# Patient Record
Sex: Female | Born: 1935 | Race: White | Hispanic: No | Smoking: Never smoker
Health system: Southern US, Community
[De-identification: ages and names within clinical notes are randomized; demographics above are authoritative.]

## PROBLEM LIST (undated history)

## (undated) DIAGNOSIS — I499 Cardiac arrhythmia, unspecified: Secondary | ICD-10-CM

## (undated) DIAGNOSIS — M810 Age-related osteoporosis without current pathological fracture: Secondary | ICD-10-CM

## (undated) DIAGNOSIS — N189 Chronic kidney disease, unspecified: Secondary | ICD-10-CM

## (undated) DIAGNOSIS — K219 Gastro-esophageal reflux disease without esophagitis: Secondary | ICD-10-CM

## (undated) DIAGNOSIS — I10 Essential (primary) hypertension: Secondary | ICD-10-CM

## (undated) DIAGNOSIS — E7800 Pure hypercholesterolemia, unspecified: Secondary | ICD-10-CM

## (undated) DIAGNOSIS — E039 Hypothyroidism, unspecified: Secondary | ICD-10-CM

## (undated) HISTORY — PX: COLONOSCOPY: SHX174

---

## 2010-11-30 ENCOUNTER — Ambulatory Visit: Payer: Self-pay | Admitting: Endocrinology

## 2015-10-14 ENCOUNTER — Ambulatory Visit
Admission: RE | Admit: 2015-10-14 | Discharge: 2015-10-14 | Disposition: A | Discharge status: Home / Self Care | Payer: Medicare HMO | Source: Ambulatory Visit | Attending: Ophthalmology | Admitting: Ophthalmology

## 2015-10-14 ENCOUNTER — Encounter
Admission: RE | Disposition: A | Discharge status: Home / Self Care | Payer: Self-pay | Source: Ambulatory Visit | Attending: Ophthalmology

## 2015-10-14 ENCOUNTER — Ambulatory Visit: Payer: Medicare HMO | Admitting: Certified Registered"

## 2015-10-14 ENCOUNTER — Encounter: Payer: Self-pay | Admitting: *Deleted

## 2015-10-14 DIAGNOSIS — I129 Hypertensive chronic kidney disease with stage 1 through stage 4 chronic kidney disease, or unspecified chronic kidney disease: Secondary | ICD-10-CM | POA: Diagnosis not present

## 2015-10-14 DIAGNOSIS — Z79899 Other long term (current) drug therapy: Secondary | ICD-10-CM | POA: Insufficient documentation

## 2015-10-14 DIAGNOSIS — N189 Chronic kidney disease, unspecified: Secondary | ICD-10-CM | POA: Diagnosis not present

## 2015-10-14 DIAGNOSIS — E039 Hypothyroidism, unspecified: Secondary | ICD-10-CM | POA: Diagnosis not present

## 2015-10-14 DIAGNOSIS — E7800 Pure hypercholesterolemia, unspecified: Secondary | ICD-10-CM | POA: Diagnosis not present

## 2015-10-14 DIAGNOSIS — H2511 Age-related nuclear cataract, right eye: Secondary | ICD-10-CM | POA: Insufficient documentation

## 2015-10-14 DIAGNOSIS — Z883 Allergy status to other anti-infective agents status: Secondary | ICD-10-CM | POA: Diagnosis not present

## 2015-10-14 DIAGNOSIS — M810 Age-related osteoporosis without current pathological fracture: Secondary | ICD-10-CM | POA: Diagnosis not present

## 2015-10-14 DIAGNOSIS — Z9889 Other specified postprocedural states: Secondary | ICD-10-CM | POA: Insufficient documentation

## 2015-10-14 DIAGNOSIS — H269 Unspecified cataract: Secondary | ICD-10-CM | POA: Diagnosis present

## 2015-10-14 HISTORY — DX: Age-related osteoporosis without current pathological fracture: M81.0

## 2015-10-14 HISTORY — DX: Essential (primary) hypertension: I10

## 2015-10-14 HISTORY — DX: Hypothyroidism, unspecified: E03.9

## 2015-10-14 HISTORY — PX: CATARACT EXTRACTION W/PHACO: SHX586

## 2015-10-14 HISTORY — DX: Pure hypercholesterolemia, unspecified: E78.00

## 2015-10-14 HISTORY — DX: Cardiac arrhythmia, unspecified: I49.9

## 2015-10-14 HISTORY — DX: Chronic kidney disease, unspecified: N18.9

## 2015-10-14 SURGERY — PHACOEMULSIFICATION, CATARACT, WITH IOL INSERTION
Anesthesia: Monitor Anesthesia Care | Site: Eye | Laterality: Right | Wound class: Clean

## 2015-10-14 MED ORDER — TETRACAINE HCL 0.5 % OP SOLN
OPHTHALMIC | Status: AC
Start: 2015-10-14 — End: 2015-10-14
  Administered 2015-10-14: 1 [drp] via OPHTHALMIC
  Filled 2015-10-14: qty 2

## 2015-10-14 MED ORDER — MOXIFLOXACIN HCL 0.5 % OP SOLN
OPHTHALMIC | Status: AC
  Filled 2015-10-14: qty 3

## 2015-10-14 MED ORDER — SODIUM CHLORIDE 0.9 % IV SOLN
INTRAVENOUS | Status: DC
  Administered 2015-10-14: 08:00:00 via INTRAVENOUS

## 2015-10-14 MED ORDER — EPINEPHRINE HCL 1 MG/ML IJ SOLN
INTRAOCULAR | Status: DC | PRN
  Administered 2015-10-14: 1 mL via OPHTHALMIC

## 2015-10-14 MED ORDER — POVIDONE-IODINE 5 % OP SOLN
OPHTHALMIC | Status: AC
  Administered 2015-10-14: 1 via OPHTHALMIC
  Filled 2015-10-14: qty 30

## 2015-10-14 MED ORDER — NA CHONDROIT SULF-NA HYALURON 40-17 MG/ML IO SOLN
INTRAOCULAR | Status: AC
  Filled 2015-10-14: qty 1

## 2015-10-14 MED ORDER — CEFUROXIME OPHTHALMIC INJECTION 1 MG/0.1 ML
INJECTION | OPHTHALMIC | Status: DC | PRN
  Administered 2015-10-14: .1 mL via INTRACAMERAL

## 2015-10-14 MED ORDER — MOXIFLOXACIN HCL 0.5 % OP SOLN
OPHTHALMIC | Status: DC | PRN
  Administered 2015-10-14: 1 [drp] via OPHTHALMIC

## 2015-10-14 MED ORDER — EPINEPHRINE HCL 1 MG/ML IJ SOLN
INTRAMUSCULAR | Status: AC
  Filled 2015-10-14: qty 1

## 2015-10-14 MED ORDER — CARBACHOL 0.01 % IO SOLN
INTRAOCULAR | Status: DC | PRN
  Administered 2015-10-14: .5 mL via INTRAOCULAR

## 2015-10-14 MED ORDER — ARMC OPHTHALMIC DILATING GEL
1.0000 "application " | OPHTHALMIC | Status: DC | PRN
  Administered 2015-10-14: 1 via OPHTHALMIC

## 2015-10-14 MED ORDER — CEFUROXIME OPHTHALMIC INJECTION 1 MG/0.1 ML
INJECTION | OPHTHALMIC | Status: AC
  Filled 2015-10-14: qty 0.1

## 2015-10-14 MED ORDER — MIDAZOLAM HCL 2 MG/2ML IJ SOLN
INTRAMUSCULAR | Status: DC | PRN
  Administered 2015-10-14: 1 mg via INTRAVENOUS

## 2015-10-14 MED ORDER — MOXIFLOXACIN HCL 0.5 % OP SOLN: 1.0000 [drp] | OPHTHALMIC | Status: DC | PRN

## 2015-10-14 MED ORDER — POVIDONE-IODINE 5 % OP SOLN
1.0000 "application " | OPHTHALMIC | Status: AC | PRN
  Administered 2015-10-14: 1 via OPHTHALMIC

## 2015-10-14 MED ORDER — ARMC OPHTHALMIC DILATING GEL
OPHTHALMIC | Status: AC
  Administered 2015-10-14: 1 via OPHTHALMIC
  Filled 2015-10-14: qty 0.25

## 2015-10-14 MED ORDER — TETRACAINE HCL 0.5 % OP SOLN
1.0000 [drp] | Freq: Once | OPHTHALMIC | Status: AC
  Administered 2015-10-14: 1 [drp] via OPHTHALMIC

## 2015-10-14 MED ORDER — NA CHONDROIT SULF-NA HYALURON 40-17 MG/ML IO SOLN
INTRAOCULAR | Status: DC | PRN
  Administered 2015-10-14: 1 mL via INTRAOCULAR

## 2015-10-14 SURGICAL SUPPLY — 22 items
CANNULA ANT/CHMB 27GA (MISCELLANEOUS) ×3 IMPLANT
CUP MEDICINE 2OZ PLAST GRAD ST (MISCELLANEOUS) ×3 IMPLANT
GLOVE BIO SURGEON STRL SZ8 (GLOVE) ×3 IMPLANT
GLOVE BIOGEL M 6.5 STRL (GLOVE) ×3 IMPLANT
GLOVE SURG LX 8.0 MICRO (GLOVE) ×2
GLOVE SURG LX STRL 8.0 MICRO (GLOVE) ×1 IMPLANT
GOWN STRL REUS W/ TWL LRG LVL3 (GOWN DISPOSABLE) ×2 IMPLANT
GOWN STRL REUS W/TWL LRG LVL3 (GOWN DISPOSABLE) ×4
LENS IOL TECNIS 19.0 (Intraocular Lens) ×3 IMPLANT
LENS IOL TECNIS MONO 1P 19.0 (Intraocular Lens) ×1 IMPLANT
PACK CATARACT (MISCELLANEOUS) ×3 IMPLANT
PACK CATARACT BRASINGTON LX (MISCELLANEOUS) ×3 IMPLANT
PACK EYE AFTER SURG (MISCELLANEOUS) ×3 IMPLANT
SOL BSS BAG (MISCELLANEOUS) ×3
SOL PREP PVP 2OZ (MISCELLANEOUS) ×3
SOLUTION BSS BAG (MISCELLANEOUS) ×1 IMPLANT
SOLUTION PREP PVP 2OZ (MISCELLANEOUS) ×1 IMPLANT
SYR 3ML LL SCALE MARK (SYRINGE) ×3 IMPLANT
SYR 5ML LL (SYRINGE) ×3 IMPLANT
SYR TB 1ML 27GX1/2 LL (SYRINGE) ×3 IMPLANT
WATER STERILE IRR 1000ML POUR (IV SOLUTION) ×3 IMPLANT
WIPE NON LINTING 3.25X3.25 (MISCELLANEOUS) ×3 IMPLANT

## 2015-10-14 NOTE — Transfer of Care (Signed)
Immediate Anesthesia Transfer of Care Note  Patient: Megan Graves  Procedure(s) Performed: Procedure(s) with comments: CATARACT EXTRACTION PHACO AND INTRAOCULAR LENS PLACEMENT (IOC) (Right) - [place] 00:54 AP% 18.6 CDE 10.08 fluid pack lot # [id number]  Patient Location: Short Stay  Anesthesia Type:MAC  Level of Consciousness: awake and alert   Airway & Oxygen Therapy: Patient Spontanous Breathing  Post-op Assessment: Report given to RN  Post vital signs: Reviewed  Last Vitals:  Filed Vitals:   10/14/15 0746 10/14/15 0910  BP: 185/87 148/73  Pulse: 61 54  Temp: 36.6 C 36.6 C  Resp: 16 14    Complications: No apparent anesthesia complications

## 2015-10-14 NOTE — Anesthesia Preprocedure Evaluation (Signed)
Anesthesia Evaluation  Patient identified by MRN, date of birth, ID band Patient awake    Reviewed: Allergy & Precautions, H&P , NPO status , Patient's Chart, lab work & pertinent test results, reviewed documented beta blocker date and time   History of Anesthesia Complications Negative for: history of anesthetic complications  Airway Mallampati: III  TM Distance: >3 FB Neck ROM: full    Dental no notable dental hx. (+) Caps, Teeth Intact   Pulmonary neg pulmonary ROS,    Pulmonary exam normal breath sounds clear to auscultation       Cardiovascular Exercise Tolerance: Good hypertension, (-) angina(-) CAD, (-) Past MI, (-) Cardiac Stents and (-) CABG Normal cardiovascular exam+ dysrhythmias (-) Valvular Problems/Murmurs Rhythm:regular Rate:Normal     Neuro/Psych negative neurological ROS  negative psych ROS   GI/Hepatic negative GI ROS, Neg liver ROS,   Endo/Other  neg diabetesHypothyroidism   Renal/GU CRFRenal disease  negative genitourinary   Musculoskeletal   Abdominal   Peds  Hematology negative hematology ROS (+)   Anesthesia Other Findings Past Medical History:   Hypertension                                                 Dysrhythmia                                                  Hypercholesteremia                                           Chronic kidney disease                                       Osteoporosis                                                 Hypothyroidism                                               Reproductive/Obstetrics negative OB ROS                             Anesthesia Physical Anesthesia Plan  ASA: II  Anesthesia Plan: MAC   Post-op Pain Management:    Induction:   Airway Management Planned:   Additional Equipment:   Intra-op Plan:   Post-operative Plan:   Informed Consent: I have reviewed the patients History and Physical, chart,  labs and discussed the procedure including the risks, benefits and alternatives for the proposed anesthesia with the patient or authorized representative who has indicated his/her understanding and acceptance.   Dental Advisory Given  Plan Discussed with: Anesthesiologist, CRNA and Surgeon  Anesthesia Plan Comments:  Anesthesia Quick Evaluation  

## 2015-10-14 NOTE — Anesthesia Postprocedure Evaluation (Signed)
Anesthesia Post Note  Patient: Megan Graves  Procedure(s) Performed: Procedure(s) (LRB): CATARACT EXTRACTION PHACO AND INTRAOCULAR LENS PLACEMENT (IOC) (Right)  Patient location during evaluation: Short Stay Anesthesia Type: MAC Level of consciousness: awake and alert Pain management: pain level controlled Vital Signs Assessment: post-procedure vital signs reviewed and stable Respiratory status: spontaneous breathing Cardiovascular status: blood pressure returned to baseline Postop Assessment: no headache Anesthetic complications: no    Last Vitals:  Filed Vitals:   10/14/15 0746 10/14/15 0910  BP: 185/87 148/73  Pulse: 61 54  Temp: 36.6 C 36.6 C  Resp: 16 14    Last Pain: There were no vitals filed for this visit.               Mathews Argyle P

## 2015-10-14 NOTE — Op Note (Signed)
PREOPERATIVE DIAGNOSIS:  Nuclear sclerotic cataract of the right eye.   POSTOPERATIVE DIAGNOSIS: nuclear sclerotic cataract right eye   OPERATIVE PROCEDURE:  Procedure(s): CATARACT EXTRACTION PHACO AND INTRAOCULAR LENS PLACEMENT (IOC)   SURGEON:  Galen Manila, MD.   ANESTHESIA:  Anesthesiologist: Lenard Simmer, MD CRNA: Mathews Argyle, CRNA; Ginger Carne, CRNA  1.      Managed anesthesia care. 2.      Topical tetracaine drops followed by 2% Xylocaine jelly applied in the preoperative holding area.   COMPLICATIONS:  None.   TECHNIQUE:   Stop and chop   DESCRIPTION OF PROCEDURE:  The patient was examined and consented in the preoperative holding area where the aforementioned topical anesthesia was applied to the right eye and then brought back to the Operating Room where the right eye was prepped and draped in the usual sterile ophthalmic fashion and a lid speculum was placed. A paracentesis was created with the side port blade and the anterior chamber was filled with viscoelastic. A near clear corneal incision was performed with the steel keratome. A continuous curvilinear capsulorrhexis was performed with a cystotome followed by the capsulorrhexis forceps. Hydrodissection and hydrodelineation were carried out with BSS on a blunt cannula. The lens was removed in a stop and chop  technique and the remaining cortical material was removed with the irrigation-aspiration handpiece. The capsular bag was inflated with viscoelastic and the Technis ZCB00  lens was placed in the capsular bag without complication. The remaining viscoelastic was removed from the eye with the irrigation-aspiration handpiece. The wounds were hydrated. The anterior chamber was flushed with Miostat and the eye was inflated to physiologic pressure. 0.1 mL of cefuroxime concentration 10 mg/mL was placed in the anterior chamber. The wounds were found to be water tight. The eye was dressed with Vigamox. The patient was  given protective glasses to wear throughout the day and a shield with which to sleep tonight. The patient was also given drops with which to begin a drop regimen today and will follow-up with me in one day.  Implant Name Type Inv. Item Serial No. Manufacturer Lot No. LRB No. Used  LENS IMPL INTRAOC ZCB00 19.0 - [id number] Intraocular Lens LENS IMPL INTRAOC ZCB00 19.0 [id number]   Right 1   Procedure(s) with comments: CATARACT EXTRACTION PHACO AND INTRAOCULAR LENS PLACEMENT (IOC) (Right) - [place] 00:54 AP% 18.6 CDE 10.08 fluid pack lot # [id number]  Electronically signed: Meribeth Vitug LOUIS 10/14/2015 9:06 AM

## 2015-10-14 NOTE — H&P (Signed)
All labs reviewed. Abnormal studies sent to patients PCP when indicated.  Previous H&P reviewed, patient examined, there are NO CHANGES.  Megan Graves LOUIS2/14/20178:41 AM

## 2015-10-14 NOTE — Discharge Instructions (Signed)
AMBULATORY SURGERY  °DISCHARGE INSTRUCTIONS ° ° °1) The drugs that you were given will stay in your system until tomorrow so for the next 24 hours you should not: ° °A) Drive an automobile °B) Make any legal decisions °C) Drink any alcoholic beverage ° ° °2) You may resume regular meals tomorrow.  Today it is better to start with liquids and gradually work up to solid foods. ° °You may eat anything you prefer, but it is better to start with liquids, then soup and crackers, and gradually work up to solid foods. ° ° °3) Please notify your doctor immediately if you have any unusual bleeding, trouble breathing, redness and pain at the surgery site, drainage, fever, or pain not relieved by medication. ° ° ° °4) Additional Instructions: ° ° ° ° ° ° ° °Please contact your physician with any problems or Same Day Surgery at [phone], Monday through Friday 6 am to 4 pm, or [place] [facility] Main number at [phone].AMBULATORY SURGERY  °DISCHARGE INSTRUCTIONS ° ° °5) The drugs that you were given will stay in your system until tomorrow so for the next 24 hours you should not: ° °D) Drive an automobile °E) Make any legal decisions °F) Drink any alcoholic beverage ° ° °6) You may resume regular meals tomorrow.  Today it is better to start with liquids and gradually work up to solid foods. ° °You may eat anything you prefer, but it is better to start with liquids, then soup and crackers, and gradually work up to solid foods. ° ° °7) Please notify your doctor immediately if you have any unusual bleeding, trouble breathing, redness and pain at the surgery site, drainage, fever, or pain not relieved by medication. ° ° ° °8) Additional Instructions: ° ° ° ° ° ° ° °Please contact your physician with any problems or Same Day Surgery at [phone], Monday through Friday 6 am to 4 pm, or [place] [facility] Main number at [phone]. °

## 2016-06-29 ENCOUNTER — Ambulatory Visit
Admission: RE | Admit: 2016-06-29 | Discharge: 2016-06-29 | Disposition: A | Discharge status: Home / Self Care | Payer: Medicare HMO | Source: Ambulatory Visit | Attending: Ophthalmology | Admitting: Ophthalmology

## 2016-06-29 ENCOUNTER — Ambulatory Visit: Payer: Medicare HMO | Admitting: Certified Registered Nurse Anesthetist

## 2016-06-29 ENCOUNTER — Encounter: Payer: Self-pay | Admitting: *Deleted

## 2016-06-29 ENCOUNTER — Encounter
Admission: RE | Disposition: A | Discharge status: Home / Self Care | Payer: Self-pay | Source: Ambulatory Visit | Attending: Ophthalmology

## 2016-06-29 DIAGNOSIS — N189 Chronic kidney disease, unspecified: Secondary | ICD-10-CM | POA: Insufficient documentation

## 2016-06-29 DIAGNOSIS — M810 Age-related osteoporosis without current pathological fracture: Secondary | ICD-10-CM | POA: Diagnosis not present

## 2016-06-29 DIAGNOSIS — H2512 Age-related nuclear cataract, left eye: Secondary | ICD-10-CM | POA: Diagnosis present

## 2016-06-29 DIAGNOSIS — E669 Obesity, unspecified: Secondary | ICD-10-CM | POA: Diagnosis not present

## 2016-06-29 DIAGNOSIS — Z6827 Body mass index (BMI) 27.0-27.9, adult: Secondary | ICD-10-CM | POA: Diagnosis not present

## 2016-06-29 DIAGNOSIS — I10 Essential (primary) hypertension: Secondary | ICD-10-CM | POA: Insufficient documentation

## 2016-06-29 DIAGNOSIS — E7800 Pure hypercholesterolemia, unspecified: Secondary | ICD-10-CM | POA: Diagnosis not present

## 2016-06-29 DIAGNOSIS — K219 Gastro-esophageal reflux disease without esophagitis: Secondary | ICD-10-CM | POA: Diagnosis not present

## 2016-06-29 DIAGNOSIS — I129 Hypertensive chronic kidney disease with stage 1 through stage 4 chronic kidney disease, or unspecified chronic kidney disease: Secondary | ICD-10-CM | POA: Insufficient documentation

## 2016-06-29 DIAGNOSIS — E039 Hypothyroidism, unspecified: Secondary | ICD-10-CM | POA: Diagnosis not present

## 2016-06-29 HISTORY — DX: Gastro-esophageal reflux disease without esophagitis: K21.9

## 2016-06-29 HISTORY — PX: CATARACT EXTRACTION W/PHACO: SHX586

## 2016-06-29 SURGERY — PHACOEMULSIFICATION, CATARACT, WITH IOL INSERTION
Anesthesia: Monitor Anesthesia Care | Site: Eye | Laterality: Left | Wound class: Clean

## 2016-06-29 MED ORDER — ARMC OPHTHALMIC DILATING DROPS
1.0000 | OPHTHALMIC | Status: DC | PRN
Start: 2016-06-29 — End: 2016-06-29
  Administered 2016-06-29 (×3): 1 via OPHTHALMIC

## 2016-06-29 MED ORDER — MOXIFLOXACIN HCL 0.5 % OP SOLN
1.0000 [drp] | OPHTHALMIC | Status: DC | PRN
  Administered 2016-06-29 (×3): 1 [drp] via OPHTHALMIC

## 2016-06-29 MED ORDER — ARMC OPHTHALMIC DILATING DROPS
OPHTHALMIC | Status: AC
  Filled 2016-06-29: qty 0.4

## 2016-06-29 MED ORDER — MOXIFLOXACIN HCL 0.5 % OP SOLN
OPHTHALMIC | Status: DC | PRN
  Administered 2016-06-29: 1 [drp] via OPHTHALMIC

## 2016-06-29 MED ORDER — NA CHONDROIT SULF-NA HYALURON 40-17 MG/ML IO SOLN
INTRAOCULAR | Status: DC | PRN
  Administered 2016-06-29: 1 mL via INTRAOCULAR

## 2016-06-29 MED ORDER — POVIDONE-IODINE 5 % OP SOLN
OPHTHALMIC | Status: AC
  Filled 2016-06-29: qty 30

## 2016-06-29 MED ORDER — LIDOCAINE HCL (PF) 4 % IJ SOLN
INTRAMUSCULAR | Status: AC
  Filled 2016-06-29: qty 5

## 2016-06-29 MED ORDER — NA CHONDROIT SULF-NA HYALURON 40-17 MG/ML IO SOLN
INTRAOCULAR | Status: AC
  Filled 2016-06-29: qty 1

## 2016-06-29 MED ORDER — EPINEPHRINE PF 1 MG/ML IJ SOLN
INTRAOCULAR | Status: DC | PRN
  Administered 2016-06-29: 1 mL via OPHTHALMIC

## 2016-06-29 MED ORDER — LIDOCAINE HCL (PF) 4 % IJ SOLN
INTRAOCULAR | Status: DC | PRN
  Administered 2016-06-29: 4 mL via OPHTHALMIC

## 2016-06-29 MED ORDER — POVIDONE-IODINE 5 % OP SOLN
OPHTHALMIC | Status: DC | PRN
  Administered 2016-06-29: 1 via OPHTHALMIC

## 2016-06-29 MED ORDER — CARBACHOL 0.01 % IO SOLN
INTRAOCULAR | Status: DC | PRN
  Administered 2016-06-29: 0.5 mL via INTRAOCULAR

## 2016-06-29 MED ORDER — EPINEPHRINE PF 1 MG/ML IJ SOLN
INTRAMUSCULAR | Status: AC
Start: 2016-06-29 — End: 2016-06-29
  Filled 2016-06-29: qty 2

## 2016-06-29 MED ORDER — FENTANYL CITRATE (PF) 100 MCG/2ML IJ SOLN
INTRAMUSCULAR | Status: DC | PRN
  Administered 2016-06-29: 50 ug via INTRAVENOUS

## 2016-06-29 MED ORDER — MOXIFLOXACIN HCL 0.5 % OP SOLN
OPHTHALMIC | Status: AC
  Filled 2016-06-29: qty 3

## 2016-06-29 MED ORDER — SODIUM CHLORIDE 0.9 % IV SOLN
INTRAVENOUS | Status: DC
Start: 2016-06-29 — End: 2016-06-29
  Administered 2016-06-29: 10:00:00 via INTRAVENOUS

## 2016-06-29 SURGICAL SUPPLY — 21 items
CANNULA ANT/CHMB 27GA (MISCELLANEOUS) ×3 IMPLANT
CUP MEDICINE 2OZ PLAST GRAD ST (MISCELLANEOUS) ×3 IMPLANT
GLOVE BIO SURGEON STRL SZ8 (GLOVE) ×3 IMPLANT
GLOVE BIOGEL M 6.5 STRL (GLOVE) ×3 IMPLANT
GLOVE SURG LX 8.0 MICRO (GLOVE) ×2
GLOVE SURG LX STRL 8.0 MICRO (GLOVE) ×1 IMPLANT
GOWN STRL REUS W/ TWL LRG LVL3 (GOWN DISPOSABLE) ×2 IMPLANT
GOWN STRL REUS W/TWL LRG LVL3 (GOWN DISPOSABLE) ×4
LENS IOL TECNIS ITEC 19.5 (Intraocular Lens) ×3 IMPLANT
PACK CATARACT (MISCELLANEOUS) ×3 IMPLANT
PACK CATARACT BRASINGTON LX (MISCELLANEOUS) ×3 IMPLANT
PACK EYE AFTER SURG (MISCELLANEOUS) ×3 IMPLANT
SOL BSS BAG (MISCELLANEOUS) ×3
SOL PREP PVP 2OZ (MISCELLANEOUS) ×3
SOLUTION BSS BAG (MISCELLANEOUS) ×1 IMPLANT
SOLUTION PREP PVP 2OZ (MISCELLANEOUS) ×1 IMPLANT
SYR 3ML LL SCALE MARK (SYRINGE) ×3 IMPLANT
SYR 5ML LL (SYRINGE) ×3 IMPLANT
SYR TB 1ML 27GX1/2 LL (SYRINGE) ×3 IMPLANT
WATER STERILE IRR 250ML POUR (IV SOLUTION) ×3 IMPLANT
WIPE NON LINTING 3.25X3.25 (MISCELLANEOUS) ×3 IMPLANT

## 2016-06-29 NOTE — Anesthesia Postprocedure Evaluation (Signed)
Anesthesia Post Note  Patient: Megan Graves  Procedure(s) Performed: Procedure(s) (LRB): CATARACT EXTRACTION PHACO AND INTRAOCULAR LENS PLACEMENT (IOC) (Left)  Patient location during evaluation: PACU Anesthesia Type: MAC Level of consciousness: awake and alert and oriented Pain management: satisfactory to patient Vital Signs Assessment: post-procedure vital signs reviewed and stable Respiratory status: respiratory function stable Cardiovascular status: stable Anesthetic complications: no    Last Vitals:  Vitals:   06/29/16 0924  BP: (!) 150/88  Pulse: (!) 53  Resp: 16  Temp: 36.6 C    Last Pain:  Vitals:   06/29/16 0924  TempSrc: Oral                 Clydene PughBeane, Alize Borrayo D

## 2016-06-29 NOTE — Discharge Instructions (Signed)
Eye Surgery Discharge Instructions  Expect mild scratchy sensation or mild soreness. DO NOT RUB YOUR EYE!  The day of surgery:  Minimal physical activity, but bed rest is not required  No reading, computer work, or close hand work  No bending, lifting, or straining.  May watch TV  For 24 hours:  No driving, legal decisions, or alcoholic beverages  Safety precautions  Eat anything you prefer: It is better to start with liquids, then soup then solid foods.  _____ Eye patch should be worn until postoperative exam tomorrow.  ____ Solar shield eyeglasses should be worn for comfort in the sunlight/patch while sleeping  Resume all regular medications including aspirin or Coumadin if these were discontinued prior to surgery. You may shower, bathe, shave, or wash your hair. Tylenol may be taken for mild discomfort.  Call your doctor if you experience significant pain, nausea, or vomiting, fever > 101 or other signs of infection. [phone] or [phone] Specific instructions:  Follow-up Information    Carlena BjornstadPORFILIO,WILLIAM LOUIS, MD .   Specialty:  Ophthalmology Why:  November 1 at 10:45am Contact information: [address] [place] [address] [phone]

## 2016-06-29 NOTE — Transfer of Care (Signed)
Immediate Anesthesia Transfer of Care Note  Patient: Megan CapronRebecca S Graves  Procedure(s) Performed: Procedure(s) with comments: CATARACT EXTRACTION PHACO AND INTRAOCULAR LENS PLACEMENT (IOC) (Left) - [place] 48.8 AP% 19.6 CDE 9.52 Fluid pack lot # [id number]  Patient Location: PACU  Anesthesia Type:MAC  Level of Consciousness: awake, alert  and oriented  Airway & Oxygen Therapy: Patient Spontanous Breathing  Post-op Assessment: Report given to RN and Post -op Vital signs reviewed and stable  Post vital signs: Reviewed and stable  Last Vitals:  Vitals:   06/29/16 0924  BP: (!) 150/88  Pulse: (!) 53  Resp: 16  Temp: 36.6 C    Last Pain:  Vitals:   06/29/16 0924  TempSrc: Oral         Complications: No apparent anesthesia complications

## 2016-06-29 NOTE — Anesthesia Preprocedure Evaluation (Signed)
Anesthesia Evaluation  Patient identified by MRN, date of birth, ID band Patient awake    Reviewed: Allergy & Precautions, NPO status , Patient's Chart, lab work & pertinent test results  History of Anesthesia Complications Negative for: history of anesthetic complications  Airway Mallampati: II  TM Distance: >3 FB Neck ROM: Full    Dental no notable dental hx.    Pulmonary neg pulmonary ROS, neg sleep apnea, neg COPD,    breath sounds clear to auscultation- rhonchi (-) wheezing      Cardiovascular Exercise Tolerance: Good hypertension, Pt. on medications (-) CAD and (-) Past MI  Rhythm:Regular Rate:Normal - Systolic murmurs and - Diastolic murmurs    Neuro/Psych negative neurological ROS  negative psych ROS   GI/Hepatic Neg liver ROS, GERD  ,  Endo/Other  negative endocrine ROSneg diabetesHypothyroidism   Renal/GU      Musculoskeletal negative musculoskeletal ROS (+)   Abdominal (+) - obese,   Peds  Hematology negative hematology ROS (+)   Anesthesia Other Findings Past Medical History: No date: Chronic kidney disease No date: Dysrhythmia No date: GERD (gastroesophageal reflux disease) No date: Hypercholesteremia No date: Hypertension No date: Hypothyroidism No date: Osteoporosis   Reproductive/Obstetrics                             Anesthesia Physical Anesthesia Plan  ASA: II  Anesthesia Plan: MAC   Post-op Pain Management:    Induction: Intravenous  Airway Management Planned: Natural Airway  Additional Equipment:   Intra-op Plan:   Post-operative Plan:   Informed Consent: I have reviewed the patients History and Physical, chart, labs and discussed the procedure including the risks, benefits and alternatives for the proposed anesthesia with the patient or authorized representative who has indicated his/her understanding and acceptance.   Dental advisory  given  Plan Discussed with: Anesthesiologist and CRNA  Anesthesia Plan Comments:         Anesthesia Quick Evaluation

## 2016-06-29 NOTE — H&P (Signed)
All labs reviewed. Abnormal studies sent to patients PCP when indicated.  Previous H&P reviewed, patient examined, there are NO CHANGES.  Megan Graves LOUIS10/31/201710:15 AM

## 2016-06-29 NOTE — Anesthesia Procedure Notes (Signed)
Procedure Name: MAC Performed by: Tynell Winchell Pre-anesthesia Checklist: Patient identified, Emergency Drugs available, Suction available, Patient being monitored and Timeout performed Oxygen Delivery Method: Nasal cannula       

## 2016-06-29 NOTE — Op Note (Signed)
PREOPERATIVE DIAGNOSIS:  Nuclear sclerotic cataract of the left eye.   POSTOPERATIVE DIAGNOSIS:  Nuclear sclerotic cataract of the left eye.   OPERATIVE PROCEDURE: Procedure(s): CATARACT EXTRACTION PHACO AND INTRAOCULAR LENS PLACEMENT (IOC)   SURGEON:  Galen ManilaWilliam Vieno Tarrant, MD.   ANESTHESIA:  Anesthesiologist: Yevette EdwardsJames G Adams, MD CRNA: Malva Coganatherine Beane, CRNA  1.      Managed anesthesia care. 2.     0.821ml of Shugarcaine was instilled following the paracentesis   COMPLICATIONS:  None.   TECHNIQUE:   Stop and chop   DESCRIPTION OF PROCEDURE:  The patient was examined and consented in the preoperative holding area where the aforementioned topical anesthesia was applied to the left eye and then brought back to the Operating Room where the left eye was prepped and draped in the usual sterile ophthalmic fashion and a lid speculum was placed. A paracentesis was created with the side port blade and the anterior chamber was filled with viscoelastic. A near clear corneal incision was performed with the steel keratome. A continuous curvilinear capsulorrhexis was performed with a cystotome followed by the capsulorrhexis forceps. Hydrodissection and hydrodelineation were carried out with BSS on a blunt cannula. The lens was removed in a stop and chop  technique and the remaining cortical material was removed with the irrigation-aspiration handpiece. The capsular bag was inflated with viscoelastic and the Technis ZCB00 lens was placed in the capsular bag without complication. The remaining viscoelastic was removed from the eye with the irrigation-aspiration handpiece. The wounds were hydrated. The anterior chamber was flushed with Miostat and the eye was inflated to physiologic pressure. 0.811ml Vigamox was placed in the anterior chamber. The wounds were found to be water tight. The eye was dressed with Vigamox. The patient was given protective glasses to wear throughout the day and a shield with which to sleep  tonight. The patient was also given drops with which to begin a drop regimen today and will follow-up with me in one day.  Implant Name Type Inv. Item Serial No. Manufacturer Lot No. LRB No. Used  LENS IOL DIOP 19.5 - [id number]  Intraocular Lens LENS IOL DIOP 19.5 [id number]  AMO   Left 1    Procedure(s) with comments: CATARACT EXTRACTION PHACO AND INTRAOCULAR LENS PLACEMENT (IOC) (Left) - [place] 48.8 AP% 19.6 CDE 9.52 Fluid pack lot # [id number]  Electronically signed: Electa Sterry LOUIS 06/29/2016 10:38 AM

## 2016-10-11 ENCOUNTER — Other Ambulatory Visit: Payer: Self-pay | Admitting: Ophthalmology

## 2016-10-11 DIAGNOSIS — H532 Diplopia: Secondary | ICD-10-CM

## 2016-10-15 ENCOUNTER — Ambulatory Visit
Admission: RE | Admit: 2016-10-15 | Discharge: 2016-10-15 | Disposition: A | Discharge status: Home / Self Care | Payer: Medicare HMO | Source: Ambulatory Visit | Attending: Ophthalmology | Admitting: Ophthalmology

## 2016-10-15 DIAGNOSIS — I6782 Cerebral ischemia: Secondary | ICD-10-CM | POA: Diagnosis not present

## 2016-10-15 DIAGNOSIS — H532 Diplopia: Secondary | ICD-10-CM | POA: Diagnosis present

## 2016-10-15 LAB — POCT I-STAT CREATININE: CREATININE: 1.3 mg/dL — AB (ref 0.44–1.00)

## 2016-10-15 MED ORDER — GADOBENATE DIMEGLUMINE 529 MG/ML IV SOLN
8.0000 mL | Freq: Once | INTRAVENOUS | Status: AC | PRN
  Administered 2016-10-15: 8 mL via INTRAVENOUS

## 2016-10-15 MED ORDER — GADOBENATE DIMEGLUMINE 529 MG/ML IV SOLN: 15.0000 mL | Freq: Once | INTRAVENOUS | Status: DC | PRN

## 2018-01-12 IMAGING — MR MR HEAD WO/W CM
12 series · 48 of 48 positions shown · IV contrast (multihance)
Comparison: None.

CLINICAL DATA: Diplopia

EXAM:
MRI HEAD WITHOUT AND WITH CONTRAST
TECHNIQUE: Multiplanar, multiecho pulse sequences of the brain and surrounding
structures were obtained without and with intravenous contrast.
CONTRAST:  8mL MULTIHANCE GADOBENATE DIMEGLUMINE 529 MG/ML IV SOLN

[Series 2: T1 · sagittal · 5.0mm · 0.45mm/px · 3 of 23 slices shown (1 of 2)]
[im 1/23]
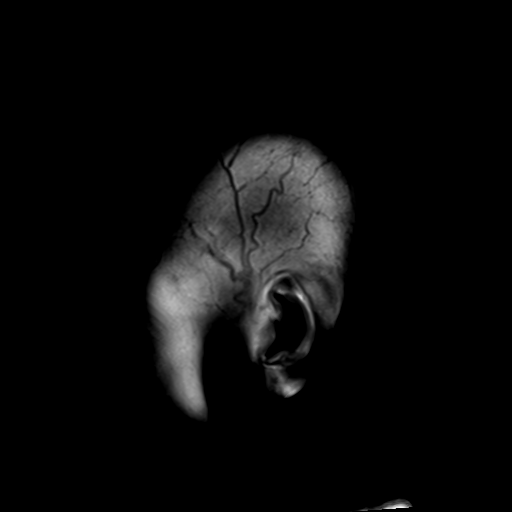
[im 12/23]
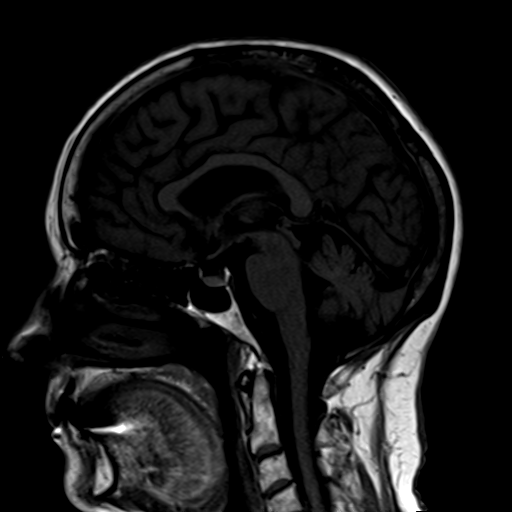
[im 23/23]
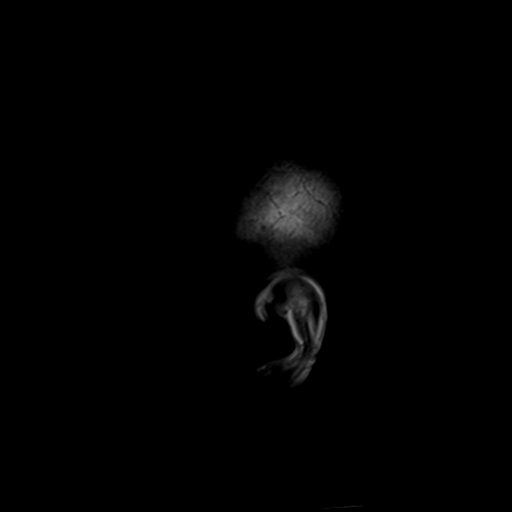

[Series 4: DWI · axial · 3.0mm · 1.20mm/px · z∈[-56,+106]mm · 6 of 55 slices shown (1 of 4)]
[im 1/55]
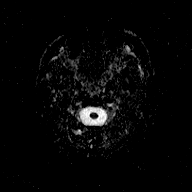
[im 11/55]
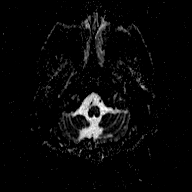
[im 22/55]
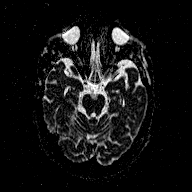
[im 33/55]
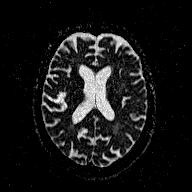
[im 44/55]
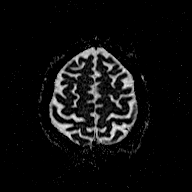
[im 55/55]
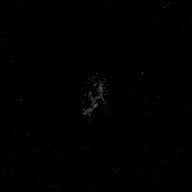

[Series 6: DWI · coronal · 3.0mm · 1.20mm/px · 5 of 45 slices shown (2 of 4)]
[im 1/45]
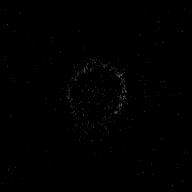
[im 12/45]
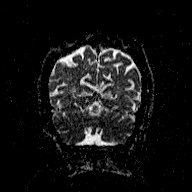
[im 23/45]
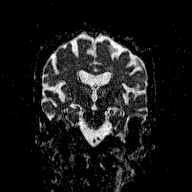
[im 34/45]
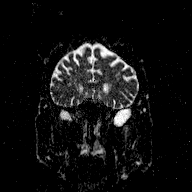
[im 45/45]
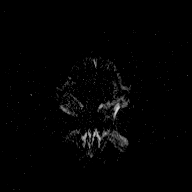

[Series 7: T2 · axial · 5.0mm · 0.72mm/px · z∈[-53,+103]mm · 3 of 25 slices shown (1 of 2)]
[im 1/25]
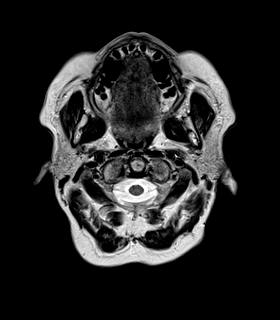
[im 13/25]
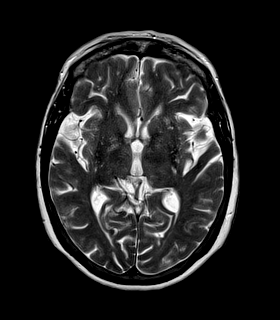
[im 25/25]
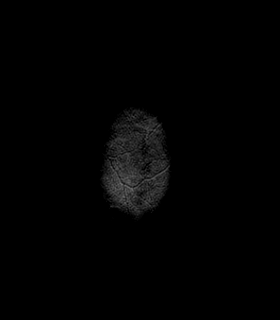

[Series 8: FLAIR · axial · 5.0mm · 0.45mm/px · z∈[-53,+103]mm · 2 of 25 slices shown]
[im 1/25]
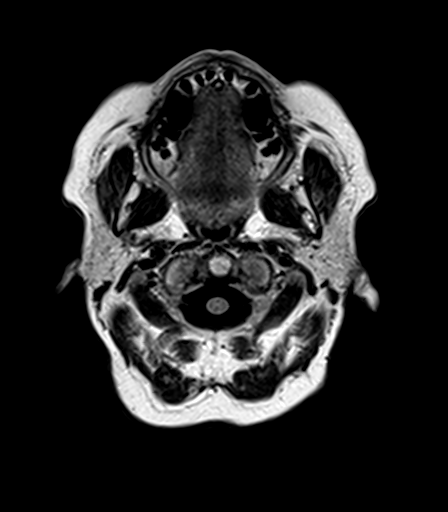
[im 25/25]
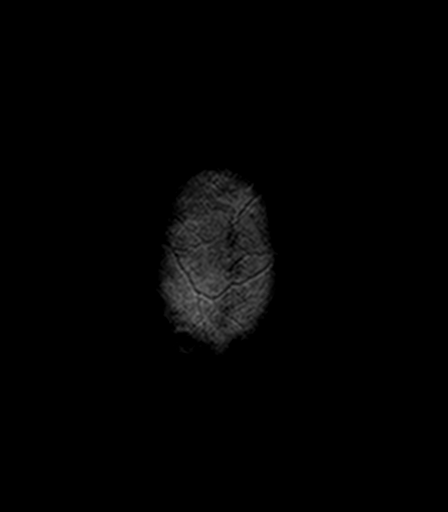

[Series 9: T2 · axial · 5.0mm · 0.72mm/px · z∈[-53,+103]mm · 2 of 25 slices shown (2 of 2)]
[im 1/25]
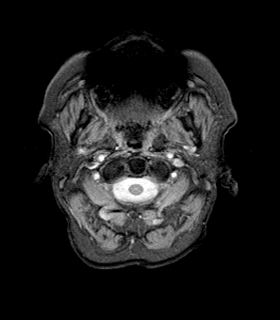
[im 25/25]
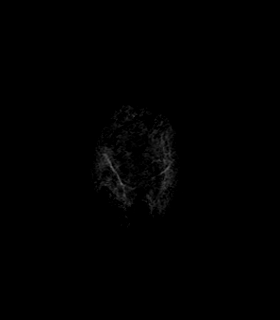

[Series 10: T1 · axial · 3.0mm · 1.00mm/px · z∈[-69,+120]mm · 6 of 64 slices shown (2 of 2)]
[im 1/64]
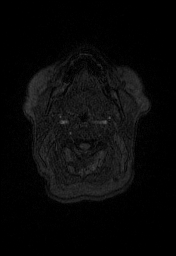
[im 13/64]
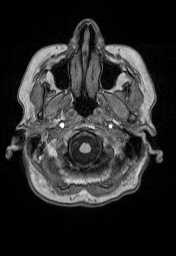
[im 26/64]
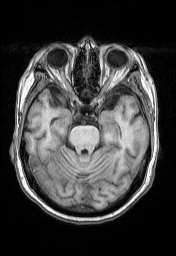
[im 38/64]
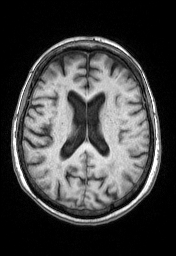
[im 51/64]
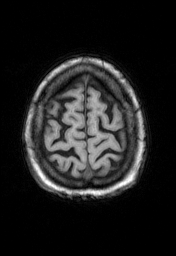
[im 64/64]
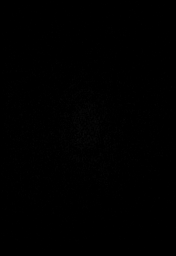

[Series 11: T2 post-contrast · coronal · 5.0mm · 0.45mm/px · 3 of 29 slices shown]
[im 1/29]
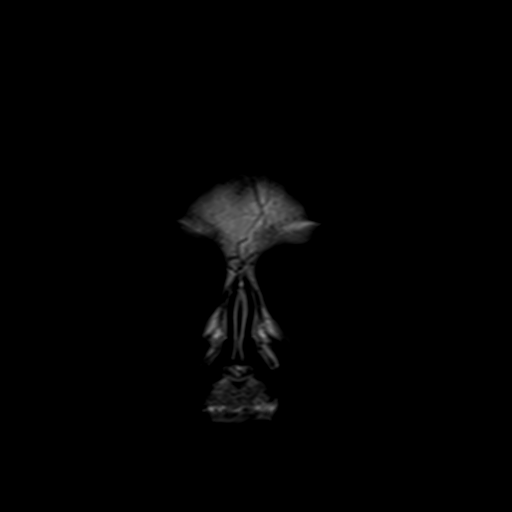
[im 15/29]
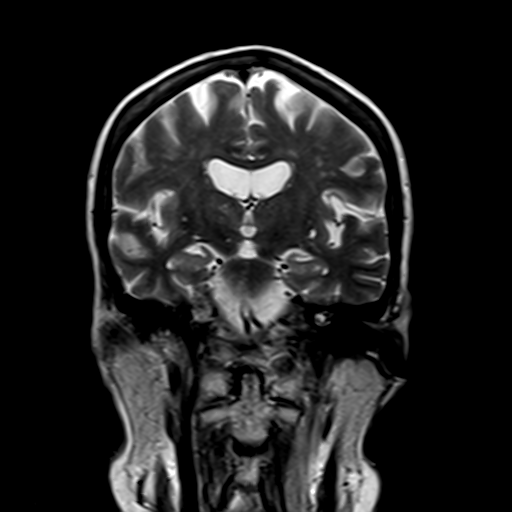
[im 29/29]
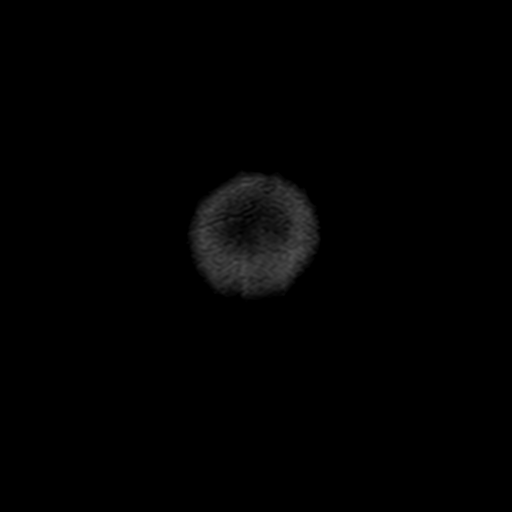

[Series 12: T1 post-contrast · axial · 3.0mm · 1.00mm/px · z∈[-69,+120]mm · 6 of 64 slices shown (1 of 2)]
[im 1/64]
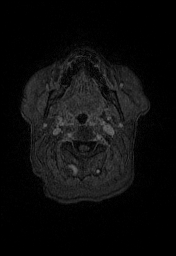
[im 13/64]
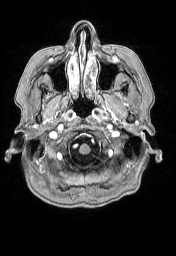
[im 26/64]
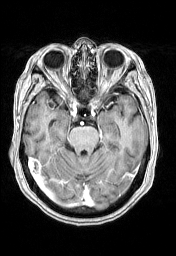
[im 38/64]
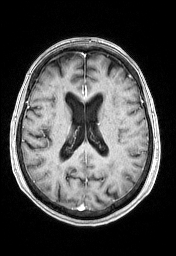
[im 51/64]
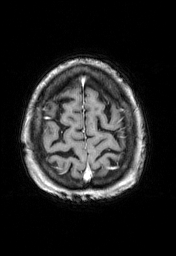
[im 64/64]
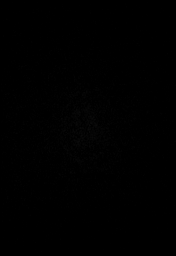

[Series 13: T1 post-contrast · coronal · 5.0mm · 0.45mm/px · 3 of 29 slices shown (2 of 2)]
[im 1/29]
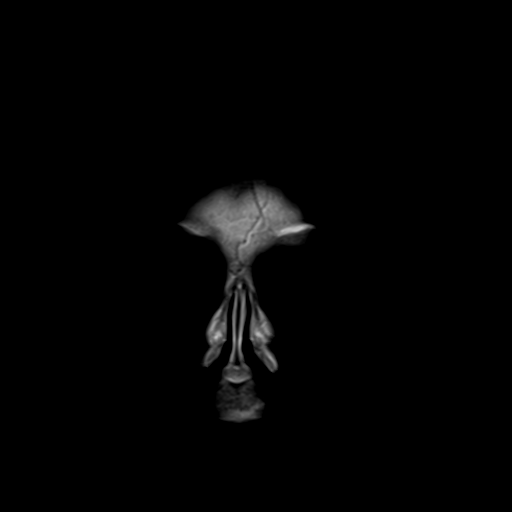
[im 15/29]
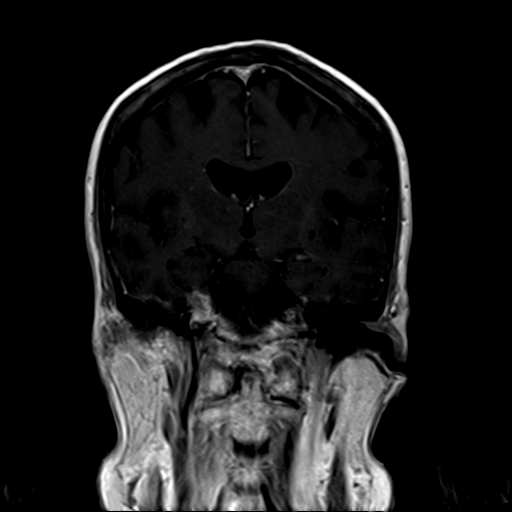
[im 29/29]
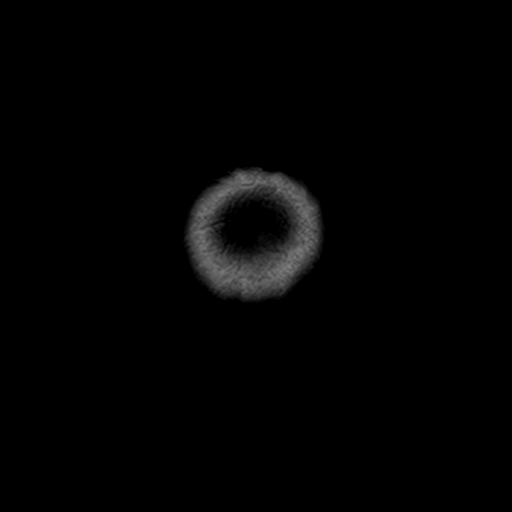

[Series 100: DWI · axial · 3.0mm · 1.20mm/px · z∈[-56,+106]mm · 5 of 55 slices shown (3 of 4)]
[im 1/55]
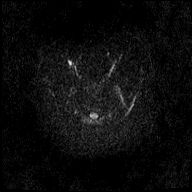
[im 14/55]
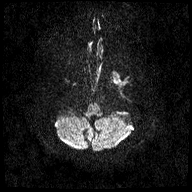
[im 28/55]
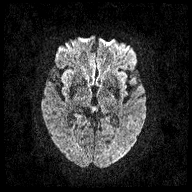
[im 41/55]
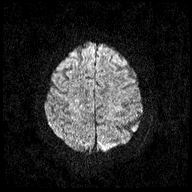
[im 55/55]
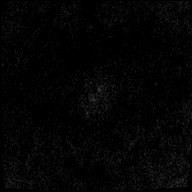

[Series 101: DWI · coronal · 3.0mm · 1.20mm/px · 4 of 45 slices shown (4 of 4)]
[im 1/45]
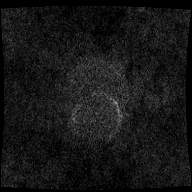
[im 15/45]
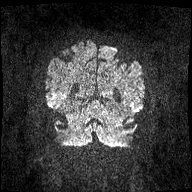
[im 30/45]
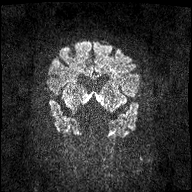
[im 45/45]
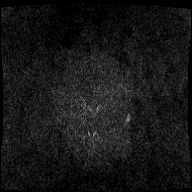

[48 of 48 positions shown; findings below may reference images not displayed]

FINDINGS: Brain: Mild atrophy, consistent with age. Negative for
hydrocephalus.

Negative for acute infarct. Widely scattered small white matter
hyperintensities bilaterally appear chronic. Small hyperintensities
in the basal ganglia and pons consistent with chronic microvascular
ischemia.

Negative for hemorrhage or mass. No edema or shift of the midline
structures. Pituitary normal in size. Normal enhancement
postcontrast administration.

Vascular: Normal arterial flow void.

Skull and upper cervical spine: Negative

Sinuses/Orbits: Negative

Other: None
IMPRESSION: No acute intracranial abnormality. Moderate chronic microvascular
ischemic changes.

## 2023-06-07 DIAGNOSIS — I129 Hypertensive chronic kidney disease with stage 1 through stage 4 chronic kidney disease, or unspecified chronic kidney disease: Secondary | ICD-10-CM | POA: Diagnosis not present

## 2023-06-07 DIAGNOSIS — N1832 Chronic kidney disease, stage 3b: Secondary | ICD-10-CM | POA: Diagnosis not present

## 2023-06-07 DIAGNOSIS — R5383 Other fatigue: Secondary | ICD-10-CM | POA: Diagnosis not present

## 2023-06-07 DIAGNOSIS — E039 Hypothyroidism, unspecified: Secondary | ICD-10-CM | POA: Diagnosis not present

## 2023-06-07 DIAGNOSIS — I10 Essential (primary) hypertension: Secondary | ICD-10-CM | POA: Diagnosis not present

## 2023-06-07 DIAGNOSIS — E538 Deficiency of other specified B group vitamins: Secondary | ICD-10-CM | POA: Diagnosis not present

## 2023-06-07 DIAGNOSIS — Z1331 Encounter for screening for depression: Secondary | ICD-10-CM | POA: Diagnosis not present

## 2023-06-07 DIAGNOSIS — M810 Age-related osteoporosis without current pathological fracture: Secondary | ICD-10-CM | POA: Diagnosis not present

## 2023-06-07 DIAGNOSIS — E785 Hyperlipidemia, unspecified: Secondary | ICD-10-CM | POA: Diagnosis not present

## 2023-06-07 DIAGNOSIS — R7303 Prediabetes: Secondary | ICD-10-CM | POA: Diagnosis not present

## 2023-06-20 DIAGNOSIS — E538 Deficiency of other specified B group vitamins: Secondary | ICD-10-CM | POA: Diagnosis not present

## 2023-06-22 DIAGNOSIS — R7303 Prediabetes: Secondary | ICD-10-CM | POA: Diagnosis not present

## 2023-06-22 DIAGNOSIS — I10 Essential (primary) hypertension: Secondary | ICD-10-CM | POA: Diagnosis not present

## 2023-07-18 ENCOUNTER — Other Ambulatory Visit: Payer: Self-pay | Admitting: Ophthalmology

## 2023-07-18 DIAGNOSIS — H4912 Fourth [trochlear] nerve palsy, left eye: Secondary | ICD-10-CM | POA: Diagnosis not present

## 2023-07-18 DIAGNOSIS — Z961 Presence of intraocular lens: Secondary | ICD-10-CM | POA: Diagnosis not present

## 2023-07-18 DIAGNOSIS — H43813 Vitreous degeneration, bilateral: Secondary | ICD-10-CM | POA: Diagnosis not present

## 2023-07-20 DIAGNOSIS — R7303 Prediabetes: Secondary | ICD-10-CM | POA: Diagnosis not present

## 2023-07-20 DIAGNOSIS — M810 Age-related osteoporosis without current pathological fracture: Secondary | ICD-10-CM | POA: Diagnosis not present

## 2023-07-20 DIAGNOSIS — E039 Hypothyroidism, unspecified: Secondary | ICD-10-CM | POA: Diagnosis not present

## 2023-07-20 DIAGNOSIS — N1832 Chronic kidney disease, stage 3b: Secondary | ICD-10-CM | POA: Diagnosis not present

## 2023-07-20 DIAGNOSIS — Z0000 Encounter for general adult medical examination without abnormal findings: Secondary | ICD-10-CM | POA: Diagnosis not present

## 2023-07-20 DIAGNOSIS — I129 Hypertensive chronic kidney disease with stage 1 through stage 4 chronic kidney disease, or unspecified chronic kidney disease: Secondary | ICD-10-CM | POA: Diagnosis not present

## 2023-07-20 DIAGNOSIS — E785 Hyperlipidemia, unspecified: Secondary | ICD-10-CM | POA: Diagnosis not present

## 2023-08-05 DIAGNOSIS — H4912 Fourth [trochlear] nerve palsy, left eye: Secondary | ICD-10-CM | POA: Diagnosis not present

## 2023-08-09 ENCOUNTER — Encounter: Payer: Self-pay | Admitting: Ophthalmology

## 2023-08-12 ENCOUNTER — Ambulatory Visit
Admission: RE | Admit: 2023-08-12 | Discharge: 2023-08-12 | Disposition: A | Discharge status: Home / Self Care | Payer: Medicare HMO | Source: Ambulatory Visit | Attending: Ophthalmology | Admitting: Ophthalmology

## 2023-08-12 DIAGNOSIS — Z8669 Personal history of other diseases of the nervous system and sense organs: Secondary | ICD-10-CM | POA: Diagnosis not present

## 2023-08-12 DIAGNOSIS — Z961 Presence of intraocular lens: Secondary | ICD-10-CM | POA: Diagnosis not present

## 2023-08-12 DIAGNOSIS — H4912 Fourth [trochlear] nerve palsy, left eye: Secondary | ICD-10-CM

## 2023-08-12 DIAGNOSIS — G9389 Other specified disorders of brain: Secondary | ICD-10-CM | POA: Diagnosis not present

## 2023-08-12 MED ORDER — GADOPICLENOL 0.5 MMOL/ML IV SOLN
7.5000 mL | Freq: Once | INTRAVENOUS | Status: AC | PRN
  Administered 2023-08-12: 7.5 mL via INTRAVENOUS

## 2023-09-07 DIAGNOSIS — E039 Hypothyroidism, unspecified: Secondary | ICD-10-CM | POA: Diagnosis not present

## 2023-09-07 DIAGNOSIS — E785 Hyperlipidemia, unspecified: Secondary | ICD-10-CM | POA: Diagnosis not present

## 2023-09-07 DIAGNOSIS — R7303 Prediabetes: Secondary | ICD-10-CM | POA: Diagnosis not present

## 2023-09-07 DIAGNOSIS — N189 Chronic kidney disease, unspecified: Secondary | ICD-10-CM | POA: Diagnosis not present

## 2023-09-07 DIAGNOSIS — I129 Hypertensive chronic kidney disease with stage 1 through stage 4 chronic kidney disease, or unspecified chronic kidney disease: Secondary | ICD-10-CM | POA: Diagnosis not present

## 2023-09-07 DIAGNOSIS — M810 Age-related osteoporosis without current pathological fracture: Secondary | ICD-10-CM | POA: Diagnosis not present

## 2023-09-07 DIAGNOSIS — Z79899 Other long term (current) drug therapy: Secondary | ICD-10-CM | POA: Diagnosis not present

## 2023-09-28 DIAGNOSIS — H4912 Fourth [trochlear] nerve palsy, left eye: Secondary | ICD-10-CM | POA: Diagnosis not present

## 2023-12-12 DIAGNOSIS — I129 Hypertensive chronic kidney disease with stage 1 through stage 4 chronic kidney disease, or unspecified chronic kidney disease: Secondary | ICD-10-CM | POA: Diagnosis not present

## 2023-12-12 DIAGNOSIS — E785 Hyperlipidemia, unspecified: Secondary | ICD-10-CM | POA: Diagnosis not present

## 2023-12-12 DIAGNOSIS — N189 Chronic kidney disease, unspecified: Secondary | ICD-10-CM | POA: Diagnosis not present

## 2023-12-12 DIAGNOSIS — R7303 Prediabetes: Secondary | ICD-10-CM | POA: Diagnosis not present

## 2023-12-12 DIAGNOSIS — E039 Hypothyroidism, unspecified: Secondary | ICD-10-CM | POA: Diagnosis not present

## 2024-01-03 DIAGNOSIS — N189 Chronic kidney disease, unspecified: Secondary | ICD-10-CM | POA: Diagnosis not present

## 2024-01-03 DIAGNOSIS — E559 Vitamin D deficiency, unspecified: Secondary | ICD-10-CM | POA: Diagnosis not present

## 2024-01-03 DIAGNOSIS — I129 Hypertensive chronic kidney disease with stage 1 through stage 4 chronic kidney disease, or unspecified chronic kidney disease: Secondary | ICD-10-CM | POA: Diagnosis not present

## 2024-02-08 DIAGNOSIS — H4912 Fourth [trochlear] nerve palsy, left eye: Secondary | ICD-10-CM | POA: Diagnosis not present

## 2024-02-22 DIAGNOSIS — W57XXXA Bitten or stung by nonvenomous insect and other nonvenomous arthropods, initial encounter: Secondary | ICD-10-CM | POA: Diagnosis not present

## 2024-02-22 DIAGNOSIS — S40861A Insect bite (nonvenomous) of right upper arm, initial encounter: Secondary | ICD-10-CM | POA: Diagnosis not present

## 2024-05-09 DIAGNOSIS — H4912 Fourth [trochlear] nerve palsy, left eye: Secondary | ICD-10-CM | POA: Diagnosis not present

## 2024-05-29 DIAGNOSIS — H4912 Fourth [trochlear] nerve palsy, left eye: Secondary | ICD-10-CM | POA: Diagnosis not present

## 2024-05-29 DIAGNOSIS — I129 Hypertensive chronic kidney disease with stage 1 through stage 4 chronic kidney disease, or unspecified chronic kidney disease: Secondary | ICD-10-CM | POA: Diagnosis not present

## 2024-05-29 DIAGNOSIS — Z0000 Encounter for general adult medical examination without abnormal findings: Secondary | ICD-10-CM | POA: Diagnosis not present

## 2024-05-29 DIAGNOSIS — Z1331 Encounter for screening for depression: Secondary | ICD-10-CM | POA: Diagnosis not present

## 2024-05-29 DIAGNOSIS — N189 Chronic kidney disease, unspecified: Secondary | ICD-10-CM | POA: Diagnosis not present

## 2024-05-29 DIAGNOSIS — Z23 Encounter for immunization: Secondary | ICD-10-CM | POA: Diagnosis not present

## 2024-05-29 DIAGNOSIS — E559 Vitamin D deficiency, unspecified: Secondary | ICD-10-CM | POA: Diagnosis not present

## 2024-05-29 DIAGNOSIS — R7303 Prediabetes: Secondary | ICD-10-CM | POA: Diagnosis not present

## 2024-05-29 DIAGNOSIS — Z2821 Immunization not carried out because of patient refusal: Secondary | ICD-10-CM | POA: Diagnosis not present

## 2024-07-09 DIAGNOSIS — H43813 Vitreous degeneration, bilateral: Secondary | ICD-10-CM | POA: Diagnosis not present

## 2024-07-09 DIAGNOSIS — H4912 Fourth [trochlear] nerve palsy, left eye: Secondary | ICD-10-CM | POA: Diagnosis not present

## 2024-07-09 DIAGNOSIS — Z961 Presence of intraocular lens: Secondary | ICD-10-CM | POA: Diagnosis not present
